# Patient Record
Sex: Male | Born: 1995 | Race: White | Hispanic: No
Health system: Midwestern US, Community
[De-identification: ages and names within clinical notes are randomized; demographics above are authoritative.]

## PROBLEM LIST (undated history)

## (undated) DIAGNOSIS — R000 Tachycardia, unspecified: Secondary | ICD-10-CM

## (undated) DIAGNOSIS — F419 Anxiety disorder, unspecified: Secondary | ICD-10-CM

## (undated) HISTORY — DX: Anxiety disorder, unspecified: F41.9

## (undated) HISTORY — DX: Tachycardia, unspecified: R00.0

---
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
## 2015-03-22 ENCOUNTER — Ambulatory Visit (INDEPENDENT_AMBULATORY_CARE_PROVIDER_SITE_OTHER): Payer: BLUE CROSS/BLUE SHIELD | Admitting: Diagnostic Neuroimaging

## 2015-03-22 ENCOUNTER — Encounter: Payer: Self-pay | Admitting: Diagnostic Neuroimaging

## 2015-03-22 VITALS — BP 134/82 | HR 70 | Ht 76.0 in | Wt 164.6 lb

## 2015-03-22 DIAGNOSIS — R569 Unspecified convulsions: Secondary | ICD-10-CM
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
## 2015-03-22 NOTE — Patient Instructions (Signed)
I will check MRi brain and EEG.

## 2015-03-28 ENCOUNTER — Encounter: Payer: Self-pay | Admitting: Diagnostic Neuroimaging

## 2015-04-05 ENCOUNTER — Ambulatory Visit (INDEPENDENT_AMBULATORY_CARE_PROVIDER_SITE_OTHER): Payer: BLUE CROSS/BLUE SHIELD | Admitting: Diagnostic Neuroimaging

## 2015-04-05 DIAGNOSIS — R569 Unspecified convulsions: Secondary | ICD-10-CM | POA: Diagnosis not present

## 2015-04-05 NOTE — Procedures (Signed)
   [facility]  EEG (ELECTROENCEPHALOGRAM) REPORT   STUDY DATE: 04/05/15 PATIENT NAME: Alex Mejia DOB: [birth year] MRN: [id number]  ORDERING CLINICIAN: Joycelyn SchmidVikram Tujuana Kilmartin, MD   TECHNOLOGIST: Gearldine ShownLorraine Jones  TECHNIQUE: Electroencephalogram was recorded utilizing standard 10-20 system of lead placement and reformatted into average and bipolar montages.  RECORDING TIME: 32 ACTIVATION: hyperventilation and photic stimulation  CLINICAL INFORMATION: 19 year old male with seizure.  FINDINGS: Background rhythms of 11-12 hertz and 80-90 microvolts. No focal, lateralizing, epileptiform activity or seizures are seen. Patient recorded in the awake state.  IMPRESSION:  Normal EEG in the awake state.    INTERPRETING PHYSICIAN:  Suanne MarkerVIKRAM R. Layia Walla, MD Certified in Neurology, Neurophysiology and Neuroimaging  [facility] [address], [address] [place], [place] [address] [phone]

## 2015-04-07 ENCOUNTER — Telehealth: Payer: Self-pay | Admitting: *Deleted

## 2015-04-07 NOTE — Telephone Encounter (Signed)
Spoke to the pt on the phone and informed him that his EEG was normal. He thanked me. Asked me about the upcoming appt and I reminded him of that date and time. He also asked about getting the MRI scheduled. I told him I would reach out to the coordinator to give him a call again and get that scheduled. He thanked me for calling

## 2015-04-12 ENCOUNTER — Other Ambulatory Visit: Payer: BLUE CROSS/BLUE SHIELD

## 2015-04-19 ENCOUNTER — Other Ambulatory Visit: Payer: BLUE CROSS/BLUE SHIELD

## 2015-04-24 ENCOUNTER — Ambulatory Visit: Payer: BLUE CROSS/BLUE SHIELD | Admitting: Diagnostic Neuroimaging

## 2015-05-03 ENCOUNTER — Other Ambulatory Visit: Payer: BLUE CROSS/BLUE SHIELD
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
## 2019-03-02 NOTE — ED Notes (Signed)
ED Patient Education Note     Patient Education Materials Follows:
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
## 2019-10-07 NOTE — ED Notes (Signed)
ED Triage Note       ED Secondary Triage Entered On:  11/03/19 13:23 EST    Performed On:  11-03-19 13:23 EST by Tresa Endo, RN, Rex Kras               General Information   Barriers to Learning :   None evident   ED Home Meds Section :   Document assessment   [facility] ED Fall Risk Section :   Document assessment   ED Advance Directives Section :   Document assessment   ED Palliative Screen :   N/A (prefilled for <65yo)   [name] - 11/03/19 13:23 EST   (As Of: 11-03-2019 13:23:41 EST)   Problems(Active)    No Chronic Problems (Cerner  :NKP )  Name of Problem:   No Chronic Problems ; Recorder:   KENWRIGHT, RN, PATRICIA A; Code:   NKP ; Last Updated:   03/02/2019 15:54 EDT ; Life Cycle Date:   03/02/2019 ; Life Cycle Status:   Active ; Vocabulary:   Cerner          Diagnoses(Active)    Unintentional ingestion - overdose  Date:   Nov 03, 2019 ; Diagnosis Type:   Reason For Visit ; Confirmation:   Complaint of ; Clinical Dx:   Unintentional ingestion - overdose ; Classification:   Medical ; Clinical Service:   Non-Specified ; Code:   PNED ; Probability:   0 ; Diagnosis Code:   [phone] F             -    Procedure History   (As Of: 11-03-2019 13:23:41 EST)     Phoebe Perch Fall Risk Assessment Tool   Hx of falling last 3 months ED Fall :   No   Patient confused or disoriented ED Fall :   No   Patient intoxicated or sedated ED Fall :   No   Patient impaired gait ED Fall :   No   Use a mobility assistance device ED Fall :   No   Patient altered elimination ED Fall :   No   [facility] ED Fall Score :   0    [name] - November 03, 2019 13:23 EST   ED Advance Directive   Advance Directive :   No   Tresa Endo RN, Rex Kras - 11-03-19 13:23 EST   Med Hx   Medication List   (As Of: 11-03-19 13:23:41 EST)

## 2019-10-07 NOTE — ED Notes (Signed)
ED Pre-Arrival Note        Pre-Arrival Summary    Name:  BLS,    Current Date:  Oct 11, 2019 13:20:35 EST  Gender:  Male  Date of Birth:    Age:  23 Male  Pre-Arrival Type:  EMS  ETA:  10/11/2019 13:34:00 EST  Primary Care Physician:    Presenting Problem:  AMS/DRUGS  Pre-Arrival User:  Gasper Sells  Referring Source:    Location:  01            PreArrival Communication Form  Emergency Department        Additional Patient Information:        Orders:  [    ] CBC                                            [     ] CT Head no contrast  [    ] BMP                                           [     ] CT Abdomen/Pelvis no contrast  [    ] PT/INR                                       [     ] CT Abdomen/Pelvis IV contrast, w/ oral contrast  [    ] Troponin                                   [     ] CT Abdomen/Pelvis IV contrast, no oral contrast  [    ] BNP                                            [     ] See ordersheet  [    ] CXR                                             [     ] Other:__________________________  [    ] EKG

## 2019-10-07 NOTE — Discharge Summary (Signed)
ED Clinical Summary                     [facility]  [address] [place] [address]  [phone]          PERSON INFORMATION  Name: Gary Cooper, Gary Cooper Age:  23 Years DOB: [birth year]   Sex: Male Language: English PCP: PCP,  NONE   Marital Status: Single Phone: [phone] Med Service: Joylene Igo   MRN: [id number] Acct# [id number] Arrival: 10/09/2019 13:18:00   Visit Reason: Unintentional ingestion - overdose; AMS/DRUG USE Acuity: 3 LOS: 000 00:57   Address:    [place] [address]   Diagnosis:      Medications:    Medications Administered During Visit:              Allergies      No Known Medication Allergies      Major Tests and Procedures:  The following procedures and tests were performed during your ED visit.  COMMON PROCEDURES%>  COMMON PROCEDURES COMMENTS%>                PROVIDER INFORMATION               Provider Role Assigned Wilburt Finlay A ED Provider 10/26/2019 13:23:45 10/24/2019 14:13:35       Attending Physician:  DEFAULT,  DOCTOR      Admit Doc  DEFAULT,  DOCTOR     Consulting Doc       VITALS INFORMATION  Vital Sign Triage Latest   Temp Oral ORAL_1%> ORAL%>   Temp Temporal TEMPORAL_1%> TEMPORAL%>   Temp Intravascular INTRAVASCULAR_1%> INTRAVASCULAR%>   Temp Axillary AXILLARY_1%> AXILLARY%>   Temp Rectal RECTAL_1%> RECTAL%>   02 Sat 100 % 100 %   Respiratory Rate RATE_1%> RATE%>   Peripheral Pulse Rate PULSE RATE_1%> PULSE RATE%>   Apical Heart Rate HEART RATE_1%> HEART RATE%>   Blood Pressure BLOOD PRESSURE_1%>/ BLOOD PRESSURE_1%>73 mmHg BLOOD PRESSURE%> / BLOOD PRESSURE%>73 mmHg                 Immunizations      No Immunizations Documented This Visit          DISCHARGE INFORMATION   Discharge Disposition: L Left Without Treatment   Discharge Location:  Left prior to being seen by provider   Discharge Date and Time:  10/28/2019 14:15:16   ED Checkout Date and Time:  10/20/2019 14:15:16     DEPART REASON INCOMPLETE  INFORMATION               Depart Action Incomplete Reason   Interactive View/I&O Patient left prior to Dr exam   Diagnosis Patient left prior to Dr exam   Patient Education Patient left prior to Dr exam   Follow-up Patient left prior to Dr exam   Nursing Admit/Discharge/Transfer Summary Patient left prior to Dr exam   Patient Understanding Patient left prior to Dr exam               Problems      Active           No Chronic Problems              Smoking Status      No Smoking Status Documented         PATIENT EDUCATION INFORMATION  Instructions:          Follow up:  ED PROVIDER DOCUMENTATION

## 2019-10-07 NOTE — ED Notes (Signed)
ED Patient Education Note     Patient Education Materials Follows:

## 2019-10-29 DEATH — deceased
